# Patient Record
Sex: Male | Born: 1945 | Race: White | Hispanic: No | Marital: Married
Health system: Southern US, Community
[De-identification: ages and names within clinical notes are randomized; demographics above are authoritative.]

---

## 2012-12-01 DIAGNOSIS — I498 Other specified cardiac arrhythmias: Secondary | ICD-10-CM | POA: Diagnosis not present

## 2012-12-01 DIAGNOSIS — Z7982 Long term (current) use of aspirin: Secondary | ICD-10-CM | POA: Diagnosis not present

## 2012-12-01 DIAGNOSIS — R5381 Other malaise: Secondary | ICD-10-CM | POA: Diagnosis not present

## 2012-12-01 DIAGNOSIS — M25529 Pain in unspecified elbow: Secondary | ICD-10-CM | POA: Diagnosis not present

## 2012-12-01 DIAGNOSIS — Z880 Allergy status to penicillin: Secondary | ICD-10-CM | POA: Diagnosis not present

## 2012-12-01 DIAGNOSIS — M79609 Pain in unspecified limb: Secondary | ICD-10-CM | POA: Diagnosis not present

## 2012-12-01 DIAGNOSIS — I10 Essential (primary) hypertension: Secondary | ICD-10-CM | POA: Diagnosis not present

## 2012-12-01 DIAGNOSIS — E785 Hyperlipidemia, unspecified: Secondary | ICD-10-CM | POA: Diagnosis not present

## 2013-05-31 DIAGNOSIS — E663 Overweight: Secondary | ICD-10-CM | POA: Diagnosis not present

## 2013-05-31 DIAGNOSIS — G43909 Migraine, unspecified, not intractable, without status migrainosus: Secondary | ICD-10-CM | POA: Diagnosis not present

## 2013-05-31 DIAGNOSIS — F17200 Nicotine dependence, unspecified, uncomplicated: Secondary | ICD-10-CM | POA: Diagnosis not present

## 2013-05-31 DIAGNOSIS — I10 Essential (primary) hypertension: Secondary | ICD-10-CM | POA: Diagnosis not present

## 2013-05-31 DIAGNOSIS — L719 Rosacea, unspecified: Secondary | ICD-10-CM | POA: Diagnosis not present

## 2013-05-31 DIAGNOSIS — E782 Mixed hyperlipidemia: Secondary | ICD-10-CM | POA: Diagnosis not present

## 2013-05-31 DIAGNOSIS — Z125 Encounter for screening for malignant neoplasm of prostate: Secondary | ICD-10-CM | POA: Diagnosis not present

## 2013-05-31 DIAGNOSIS — Z23 Encounter for immunization: Secondary | ICD-10-CM | POA: Diagnosis not present

## 2013-05-31 DIAGNOSIS — Z0000 Encounter for general adult medical examination without abnormal findings: Secondary | ICD-10-CM | POA: Diagnosis not present

## 2013-05-31 DIAGNOSIS — C44320 Squamous cell carcinoma of skin of unspecified parts of face: Secondary | ICD-10-CM | POA: Diagnosis not present

## 2013-06-07 DIAGNOSIS — Z125 Encounter for screening for malignant neoplasm of prostate: Secondary | ICD-10-CM | POA: Diagnosis not present

## 2013-06-07 DIAGNOSIS — I10 Essential (primary) hypertension: Secondary | ICD-10-CM | POA: Diagnosis not present

## 2013-06-14 DIAGNOSIS — Z85828 Personal history of other malignant neoplasm of skin: Secondary | ICD-10-CM | POA: Diagnosis not present

## 2013-06-27 DIAGNOSIS — Z87891 Personal history of nicotine dependence: Secondary | ICD-10-CM | POA: Diagnosis not present

## 2013-07-31 DIAGNOSIS — G43009 Migraine without aura, not intractable, without status migrainosus: Secondary | ICD-10-CM | POA: Diagnosis not present

## 2013-09-13 DIAGNOSIS — D126 Benign neoplasm of colon, unspecified: Secondary | ICD-10-CM | POA: Diagnosis not present

## 2013-09-13 DIAGNOSIS — Z1211 Encounter for screening for malignant neoplasm of colon: Secondary | ICD-10-CM | POA: Diagnosis not present

## 2013-09-14 ENCOUNTER — Other Ambulatory Visit (HOSPITAL_COMMUNITY): Payer: Self-pay | Admitting: Gastroenterology

## 2013-09-14 DIAGNOSIS — Z1211 Encounter for screening for malignant neoplasm of colon: Secondary | ICD-10-CM

## 2013-10-19 DIAGNOSIS — M4310 Spondylolisthesis, site unspecified: Secondary | ICD-10-CM | POA: Diagnosis not present

## 2013-10-19 DIAGNOSIS — M5430 Sciatica, unspecified side: Secondary | ICD-10-CM | POA: Diagnosis not present

## 2013-10-19 DIAGNOSIS — M545 Low back pain, unspecified: Secondary | ICD-10-CM | POA: Diagnosis not present

## 2013-11-09 ENCOUNTER — Ambulatory Visit (HOSPITAL_COMMUNITY): Payer: Self-pay

## 2013-11-26 ENCOUNTER — Ambulatory Visit
Admission: RE | Admit: 2013-11-26 | Discharge: 2013-11-26 | Disposition: A | Discharge status: Home / Self Care | Payer: Medicare Other | Source: Ambulatory Visit | Attending: Gastroenterology | Admitting: Gastroenterology

## 2013-11-26 DIAGNOSIS — K5730 Diverticulosis of large intestine without perforation or abscess without bleeding: Secondary | ICD-10-CM | POA: Diagnosis not present

## 2013-11-26 DIAGNOSIS — Z1211 Encounter for screening for malignant neoplasm of colon: Secondary | ICD-10-CM

## 2013-11-28 DIAGNOSIS — I10 Essential (primary) hypertension: Secondary | ICD-10-CM | POA: Diagnosis not present

## 2013-11-28 DIAGNOSIS — F17200 Nicotine dependence, unspecified, uncomplicated: Secondary | ICD-10-CM | POA: Diagnosis not present

## 2013-11-28 DIAGNOSIS — E782 Mixed hyperlipidemia: Secondary | ICD-10-CM | POA: Diagnosis not present

## 2013-11-28 DIAGNOSIS — E663 Overweight: Secondary | ICD-10-CM | POA: Diagnosis not present

## 2013-11-30 DIAGNOSIS — M545 Low back pain, unspecified: Secondary | ICD-10-CM | POA: Diagnosis not present

## 2013-11-30 DIAGNOSIS — M5430 Sciatica, unspecified side: Secondary | ICD-10-CM | POA: Diagnosis not present

## 2013-11-30 DIAGNOSIS — M4310 Spondylolisthesis, site unspecified: Secondary | ICD-10-CM | POA: Diagnosis not present

## 2014-03-21 DIAGNOSIS — Z1331 Encounter for screening for depression: Secondary | ICD-10-CM | POA: Diagnosis not present

## 2014-03-21 DIAGNOSIS — M549 Dorsalgia, unspecified: Secondary | ICD-10-CM | POA: Diagnosis not present

## 2014-03-21 DIAGNOSIS — E663 Overweight: Secondary | ICD-10-CM | POA: Diagnosis not present

## 2014-03-24 DIAGNOSIS — M545 Low back pain, unspecified: Secondary | ICD-10-CM | POA: Diagnosis not present

## 2014-04-02 DIAGNOSIS — M545 Low back pain, unspecified: Secondary | ICD-10-CM | POA: Diagnosis not present

## 2014-04-05 DIAGNOSIS — M545 Low back pain, unspecified: Secondary | ICD-10-CM | POA: Diagnosis not present

## 2014-04-08 DIAGNOSIS — M545 Low back pain, unspecified: Secondary | ICD-10-CM | POA: Diagnosis not present

## 2014-04-11 DIAGNOSIS — M545 Low back pain, unspecified: Secondary | ICD-10-CM | POA: Diagnosis not present

## 2014-04-16 DIAGNOSIS — M545 Low back pain, unspecified: Secondary | ICD-10-CM | POA: Diagnosis not present

## 2014-04-25 DIAGNOSIS — M545 Low back pain, unspecified: Secondary | ICD-10-CM | POA: Diagnosis not present

## 2014-05-24 DIAGNOSIS — M5430 Sciatica, unspecified side: Secondary | ICD-10-CM | POA: Diagnosis not present

## 2014-05-24 DIAGNOSIS — M545 Low back pain, unspecified: Secondary | ICD-10-CM | POA: Diagnosis not present

## 2014-05-24 DIAGNOSIS — M4310 Spondylolisthesis, site unspecified: Secondary | ICD-10-CM | POA: Diagnosis not present

## 2014-06-19 DIAGNOSIS — Z1331 Encounter for screening for depression: Secondary | ICD-10-CM | POA: Diagnosis not present

## 2014-06-19 DIAGNOSIS — F17200 Nicotine dependence, unspecified, uncomplicated: Secondary | ICD-10-CM | POA: Diagnosis not present

## 2014-06-19 DIAGNOSIS — Z6836 Body mass index (BMI) 36.0-36.9, adult: Secondary | ICD-10-CM | POA: Diagnosis not present

## 2014-06-19 DIAGNOSIS — E663 Overweight: Secondary | ICD-10-CM | POA: Diagnosis not present

## 2014-06-19 DIAGNOSIS — Z125 Encounter for screening for malignant neoplasm of prostate: Secondary | ICD-10-CM | POA: Diagnosis not present

## 2014-06-19 DIAGNOSIS — Z0000 Encounter for general adult medical examination without abnormal findings: Secondary | ICD-10-CM | POA: Diagnosis not present

## 2014-06-19 DIAGNOSIS — I10 Essential (primary) hypertension: Secondary | ICD-10-CM | POA: Diagnosis not present

## 2014-06-19 DIAGNOSIS — Z23 Encounter for immunization: Secondary | ICD-10-CM | POA: Diagnosis not present

## 2014-06-19 DIAGNOSIS — E782 Mixed hyperlipidemia: Secondary | ICD-10-CM | POA: Diagnosis not present

## 2014-07-15 DIAGNOSIS — Z23 Encounter for immunization: Secondary | ICD-10-CM | POA: Diagnosis not present

## 2014-09-04 DIAGNOSIS — M542 Cervicalgia: Secondary | ICD-10-CM | POA: Diagnosis not present

## 2014-09-09 DIAGNOSIS — M5412 Radiculopathy, cervical region: Secondary | ICD-10-CM | POA: Diagnosis not present

## 2014-09-09 DIAGNOSIS — M542 Cervicalgia: Secondary | ICD-10-CM | POA: Diagnosis not present

## 2014-09-10 DIAGNOSIS — Z08 Encounter for follow-up examination after completed treatment for malignant neoplasm: Secondary | ICD-10-CM | POA: Diagnosis not present

## 2014-09-10 DIAGNOSIS — Z85828 Personal history of other malignant neoplasm of skin: Secondary | ICD-10-CM | POA: Diagnosis not present

## 2014-09-12 DIAGNOSIS — M542 Cervicalgia: Secondary | ICD-10-CM | POA: Diagnosis not present

## 2014-09-12 DIAGNOSIS — M5412 Radiculopathy, cervical region: Secondary | ICD-10-CM | POA: Diagnosis not present

## 2014-09-16 DIAGNOSIS — M5412 Radiculopathy, cervical region: Secondary | ICD-10-CM | POA: Diagnosis not present

## 2014-09-16 DIAGNOSIS — M542 Cervicalgia: Secondary | ICD-10-CM | POA: Diagnosis not present

## 2014-09-19 DIAGNOSIS — M542 Cervicalgia: Secondary | ICD-10-CM | POA: Diagnosis not present

## 2014-09-19 DIAGNOSIS — M5412 Radiculopathy, cervical region: Secondary | ICD-10-CM | POA: Diagnosis not present

## 2014-09-24 DIAGNOSIS — M542 Cervicalgia: Secondary | ICD-10-CM | POA: Diagnosis not present

## 2014-09-24 DIAGNOSIS — M5412 Radiculopathy, cervical region: Secondary | ICD-10-CM | POA: Diagnosis not present

## 2014-10-01 DIAGNOSIS — M542 Cervicalgia: Secondary | ICD-10-CM | POA: Diagnosis not present

## 2014-10-01 DIAGNOSIS — M5412 Radiculopathy, cervical region: Secondary | ICD-10-CM | POA: Diagnosis not present

## 2014-10-03 DIAGNOSIS — M542 Cervicalgia: Secondary | ICD-10-CM | POA: Diagnosis not present

## 2014-10-03 DIAGNOSIS — M5412 Radiculopathy, cervical region: Secondary | ICD-10-CM | POA: Diagnosis not present

## 2014-10-07 DIAGNOSIS — M5412 Radiculopathy, cervical region: Secondary | ICD-10-CM | POA: Diagnosis not present

## 2014-10-07 DIAGNOSIS — M542 Cervicalgia: Secondary | ICD-10-CM | POA: Diagnosis not present

## 2014-10-11 DIAGNOSIS — M5412 Radiculopathy, cervical region: Secondary | ICD-10-CM | POA: Diagnosis not present

## 2014-10-11 DIAGNOSIS — M542 Cervicalgia: Secondary | ICD-10-CM | POA: Diagnosis not present

## 2014-10-14 DIAGNOSIS — M5412 Radiculopathy, cervical region: Secondary | ICD-10-CM | POA: Diagnosis not present

## 2014-10-14 DIAGNOSIS — M542 Cervicalgia: Secondary | ICD-10-CM | POA: Diagnosis not present

## 2014-10-17 DIAGNOSIS — M542 Cervicalgia: Secondary | ICD-10-CM | POA: Diagnosis not present

## 2014-10-17 DIAGNOSIS — M5412 Radiculopathy, cervical region: Secondary | ICD-10-CM | POA: Diagnosis not present

## 2014-10-31 DIAGNOSIS — M5412 Radiculopathy, cervical region: Secondary | ICD-10-CM | POA: Diagnosis not present

## 2014-10-31 DIAGNOSIS — M542 Cervicalgia: Secondary | ICD-10-CM | POA: Diagnosis not present

## 2014-11-06 DIAGNOSIS — M542 Cervicalgia: Secondary | ICD-10-CM | POA: Diagnosis not present

## 2014-11-06 DIAGNOSIS — M5412 Radiculopathy, cervical region: Secondary | ICD-10-CM | POA: Diagnosis not present

## 2014-11-11 DIAGNOSIS — I10 Essential (primary) hypertension: Secondary | ICD-10-CM | POA: Diagnosis not present

## 2014-11-25 DIAGNOSIS — I10 Essential (primary) hypertension: Secondary | ICD-10-CM | POA: Diagnosis not present

## 2014-12-23 DIAGNOSIS — M545 Low back pain: Secondary | ICD-10-CM | POA: Diagnosis not present

## 2014-12-23 DIAGNOSIS — Z6837 Body mass index (BMI) 37.0-37.9, adult: Secondary | ICD-10-CM | POA: Diagnosis not present

## 2014-12-23 DIAGNOSIS — E782 Mixed hyperlipidemia: Secondary | ICD-10-CM | POA: Diagnosis not present

## 2014-12-23 DIAGNOSIS — E663 Overweight: Secondary | ICD-10-CM | POA: Diagnosis not present

## 2014-12-23 DIAGNOSIS — I10 Essential (primary) hypertension: Secondary | ICD-10-CM | POA: Diagnosis not present

## 2014-12-23 DIAGNOSIS — F17210 Nicotine dependence, cigarettes, uncomplicated: Secondary | ICD-10-CM | POA: Diagnosis not present

## 2015-01-27 DIAGNOSIS — F17200 Nicotine dependence, unspecified, uncomplicated: Secondary | ICD-10-CM | POA: Diagnosis not present

## 2015-01-27 DIAGNOSIS — I10 Essential (primary) hypertension: Secondary | ICD-10-CM | POA: Diagnosis not present

## 2015-06-23 DIAGNOSIS — Z720 Tobacco use: Secondary | ICD-10-CM | POA: Diagnosis not present

## 2015-06-23 DIAGNOSIS — I10 Essential (primary) hypertension: Secondary | ICD-10-CM | POA: Diagnosis not present

## 2015-06-23 DIAGNOSIS — Z125 Encounter for screening for malignant neoplasm of prostate: Secondary | ICD-10-CM | POA: Diagnosis not present

## 2015-06-23 DIAGNOSIS — Z23 Encounter for immunization: Secondary | ICD-10-CM | POA: Diagnosis not present

## 2015-06-23 DIAGNOSIS — Z1389 Encounter for screening for other disorder: Secondary | ICD-10-CM | POA: Diagnosis not present

## 2015-06-23 DIAGNOSIS — L719 Rosacea, unspecified: Secondary | ICD-10-CM | POA: Diagnosis not present

## 2015-06-23 DIAGNOSIS — Z0000 Encounter for general adult medical examination without abnormal findings: Secondary | ICD-10-CM | POA: Diagnosis not present

## 2015-06-23 DIAGNOSIS — Z6836 Body mass index (BMI) 36.0-36.9, adult: Secondary | ICD-10-CM | POA: Diagnosis not present

## 2015-06-23 DIAGNOSIS — E782 Mixed hyperlipidemia: Secondary | ICD-10-CM | POA: Diagnosis not present

## 2015-06-23 DIAGNOSIS — E663 Overweight: Secondary | ICD-10-CM | POA: Diagnosis not present

## 2015-06-23 DIAGNOSIS — M549 Dorsalgia, unspecified: Secondary | ICD-10-CM | POA: Diagnosis not present

## 2015-07-18 IMAGING — RF DG BE W/ AIR HIGH DENSITY
19 of 24 series · 19 of 24 positions shown · non-contrast
Comparison: Report of optical colonoscopy of 09/13/2013.

FLUOROSCOPY TIME:  9 min and 48 seconds

CLINICAL DATA: Incomplete colonoscopy. Optical colonoscopy
evaluating to the level of the hepatic flexure.

EXAM:
AIR CONTRAST BARIUM ENEMA
TECHNIQUE: Initial scout AP supine abdominal image obtained to insure adequate
colon cleansing. Barium was introduced into the colon in a
retrograde fashion and refluxed from the rectum to the distal
transverse colon. As much of the barium as possible was then removed
through the indwelling tube via gravity drain. Air was then
insufflated into the colon. Spot images of the colon followed by
overhead radiographs were obtained.

[Series 1: run · 1 of 1 slices shown (1 of 19)]
[im 1/1]
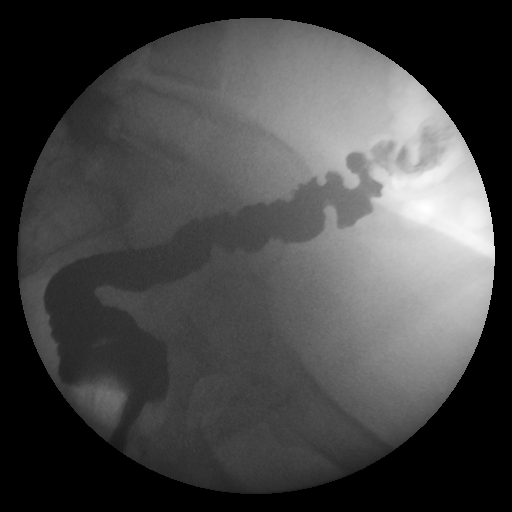

[Series 2: run · 1 of 1 slices shown (2 of 19)]
[im 1/1]
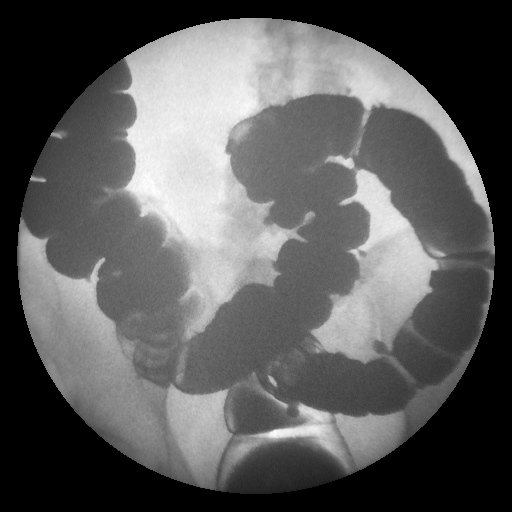

[Series 4: run · 1 of 1 slices shown (3 of 19)]
[im 1/1]
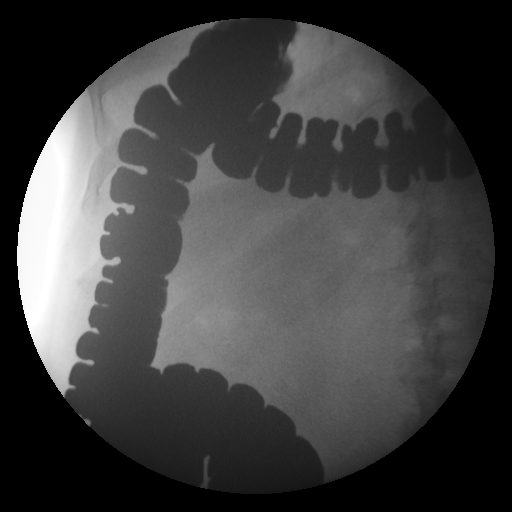

[Series 5: run · 1 of 1 slices shown (4 of 19)]
[im 1/1]
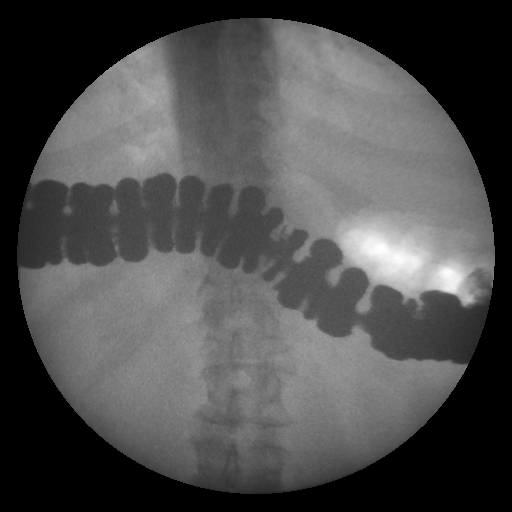

[Series 6: run · 1 of 1 slices shown (5 of 19)]
[im 1/1]
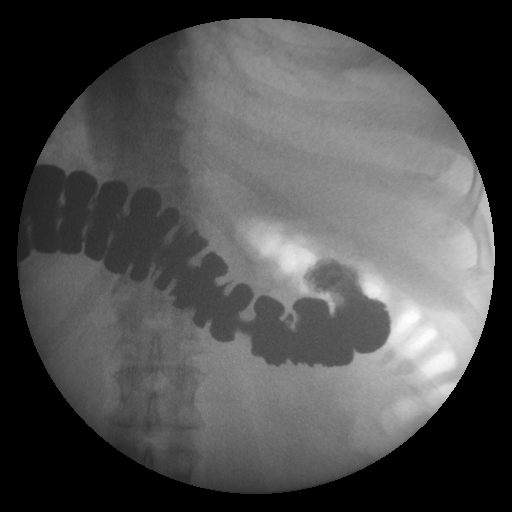

[Series 7: run · 1 of 1 slices shown (6 of 19)]
[im 1/1]
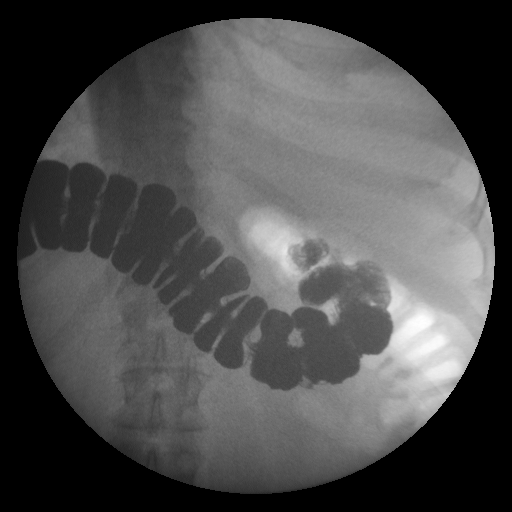

[Series 9: run · 1 of 1 slices shown (7 of 19)]
[im 1/1]
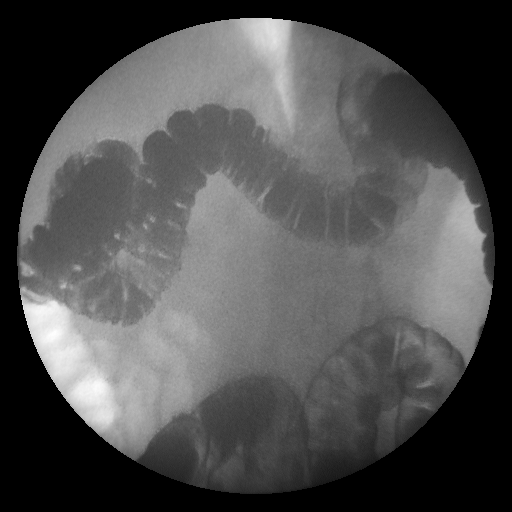

[Series 10: run · 1 of 1 slices shown (8 of 19)]
[im 1/1]
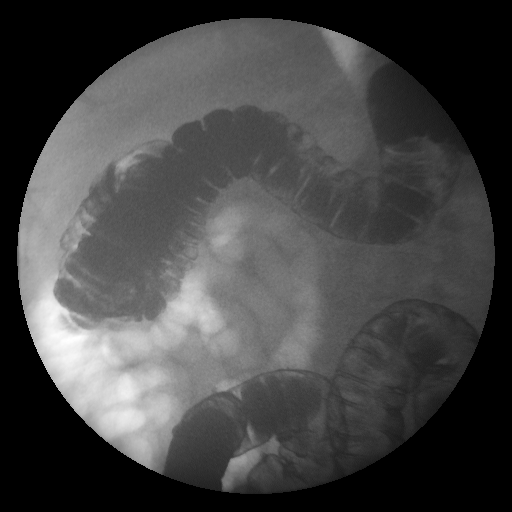

[Series 11: run · 1 of 1 slices shown (9 of 19)]
[im 1/1]
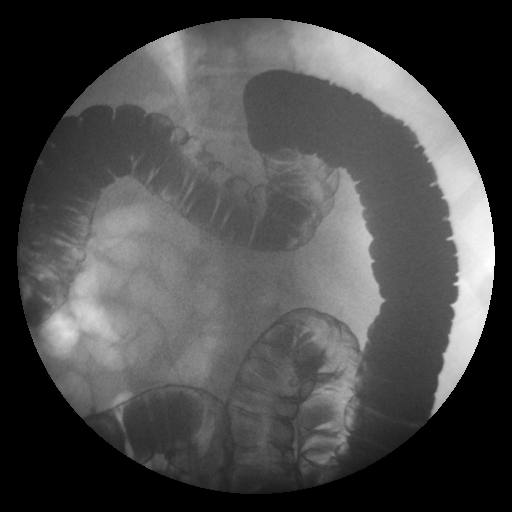

[Series 13: run · 1 of 1 slices shown (10 of 19)]
[im 1/1]
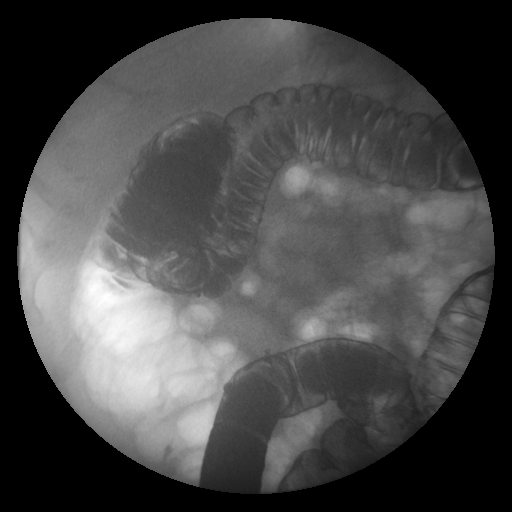

[Series 14: run · 1 of 1 slices shown (11 of 19)]
[im 1/1]
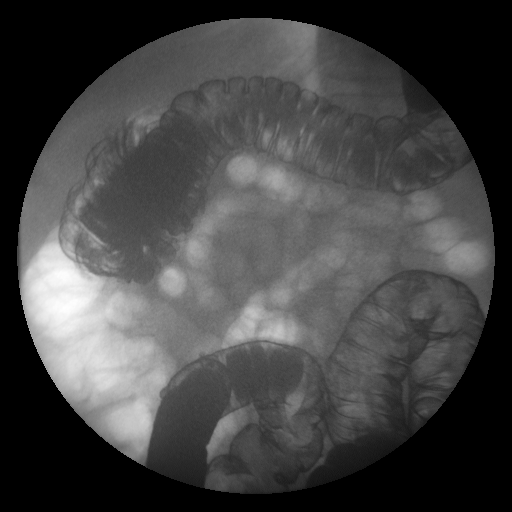

[Series 15: run · 1 of 1 slices shown (12 of 19)]
[im 1/1]
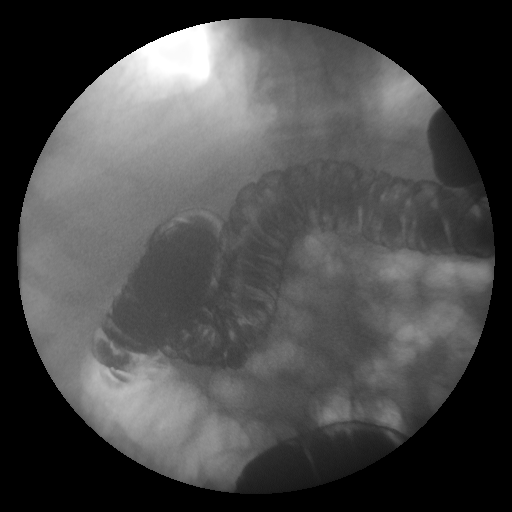

[Series 16: run · 1 of 1 slices shown (13 of 19)]
[im 1/1]
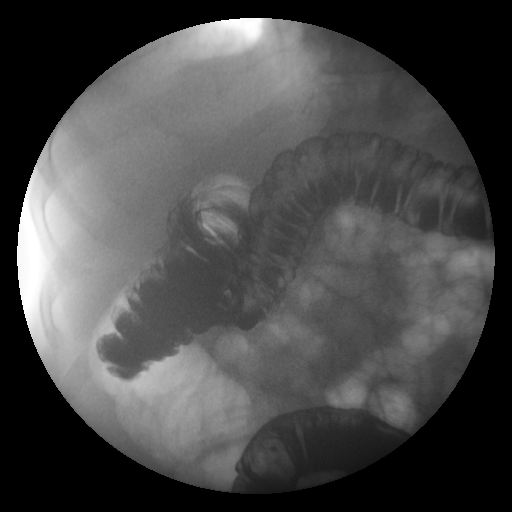

[Series 18: run · 1 of 1 slices shown (14 of 19)]
[im 1/1]
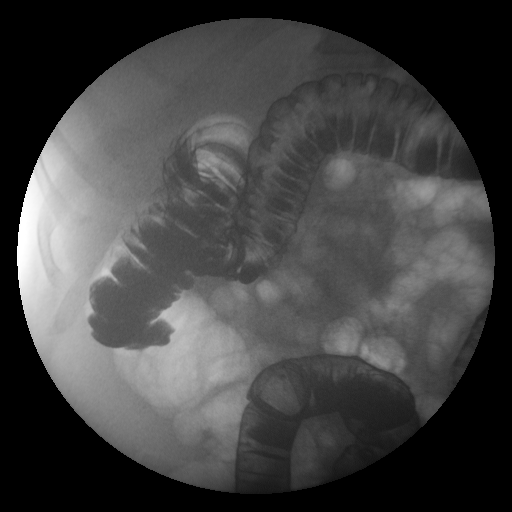

[Series 19: run · 1 of 1 slices shown (15 of 19)]
[im 1/1]
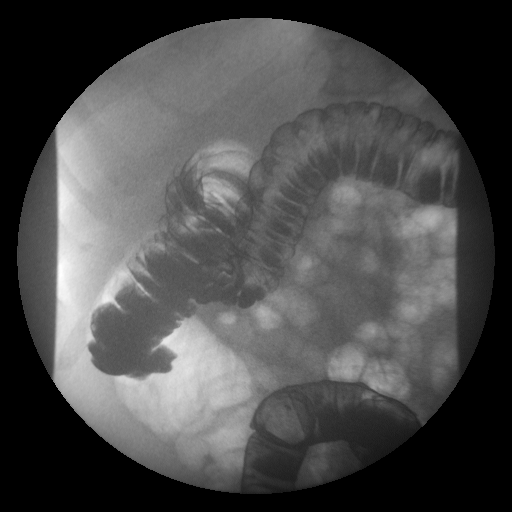

[Series 20: run · 1 of 1 slices shown (16 of 19)]
[im 1/1]
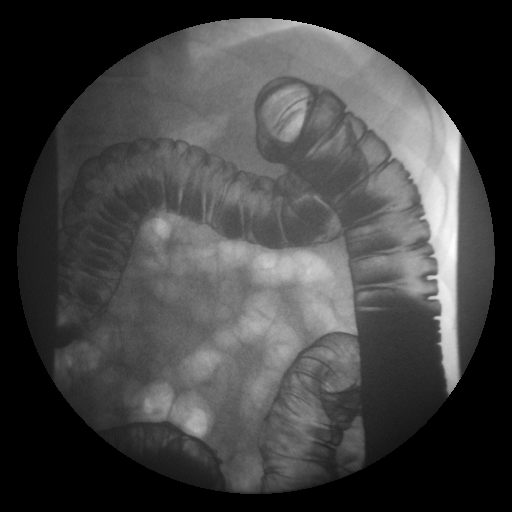

[Series 21: run · 1 of 1 slices shown (17 of 19)]
[im 1/1]
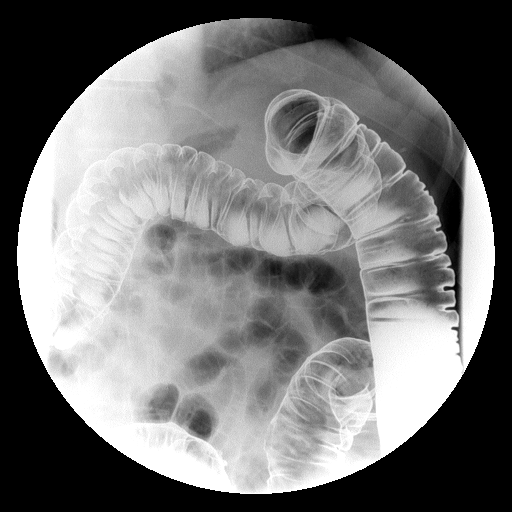

[Series 23: run · 1 of 1 slices shown (18 of 19)]
[im 1/1]
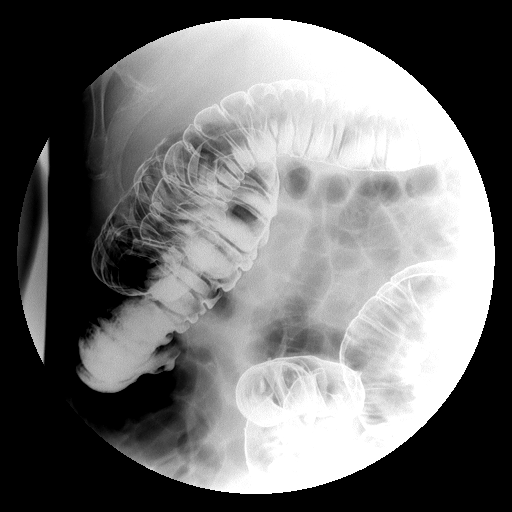

[Series 24: run · 1 of 1 slices shown (19 of 19)]
[im 1/1]
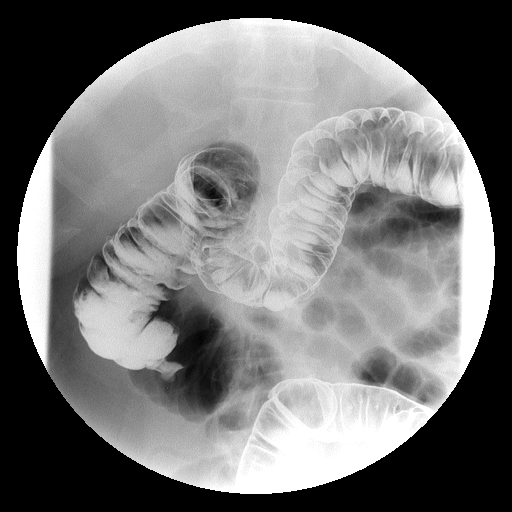

[19 of 24 positions shown; findings below may reference images not displayed]

FINDINGS: Preprocedure scout film demonstrates a nonobstructive bowel gas
pattern.

Double contrast evaluation of the colon, special attention to the
ascending colon and cecum. Scattered diverticula are seen throughout
the left side of the colon. The extent of cecal contrast and gas
coating is moderate. No evidence of polyp or mass, given this mild
limitation. The appendix and terminal ileum are not refluxed.
IMPRESSION: No evidence of colonic polyp or mass, with special attention to the
ascending colon and cecum.

## 2015-09-16 DIAGNOSIS — L719 Rosacea, unspecified: Secondary | ICD-10-CM | POA: Diagnosis not present

## 2015-09-16 DIAGNOSIS — Z08 Encounter for follow-up examination after completed treatment for malignant neoplasm: Secondary | ICD-10-CM | POA: Diagnosis not present

## 2015-09-16 DIAGNOSIS — Z85828 Personal history of other malignant neoplasm of skin: Secondary | ICD-10-CM | POA: Diagnosis not present

## 2016-07-01 DIAGNOSIS — E7800 Pure hypercholesterolemia, unspecified: Secondary | ICD-10-CM | POA: Diagnosis not present

## 2016-07-01 DIAGNOSIS — E663 Overweight: Secondary | ICD-10-CM | POA: Diagnosis not present

## 2016-07-01 DIAGNOSIS — Z125 Encounter for screening for malignant neoplasm of prostate: Secondary | ICD-10-CM | POA: Diagnosis not present

## 2016-07-01 DIAGNOSIS — Z6836 Body mass index (BMI) 36.0-36.9, adult: Secondary | ICD-10-CM | POA: Diagnosis not present

## 2016-07-01 DIAGNOSIS — Z0000 Encounter for general adult medical examination without abnormal findings: Secondary | ICD-10-CM | POA: Diagnosis not present

## 2016-07-01 DIAGNOSIS — I10 Essential (primary) hypertension: Secondary | ICD-10-CM | POA: Diagnosis not present

## 2016-07-01 DIAGNOSIS — Z23 Encounter for immunization: Secondary | ICD-10-CM | POA: Diagnosis not present

## 2016-07-01 DIAGNOSIS — Z1389 Encounter for screening for other disorder: Secondary | ICD-10-CM | POA: Diagnosis not present

## 2016-07-01 DIAGNOSIS — Z720 Tobacco use: Secondary | ICD-10-CM | POA: Diagnosis not present

## 2016-08-02 DIAGNOSIS — R799 Abnormal finding of blood chemistry, unspecified: Secondary | ICD-10-CM | POA: Diagnosis not present

## 2016-09-16 DIAGNOSIS — L719 Rosacea, unspecified: Secondary | ICD-10-CM | POA: Diagnosis not present
